# Patient Record
Sex: Male | Born: 1985 | Race: Black or African American | Hispanic: No | Marital: Single | Smoking: Never smoker
Health system: Southern US, Community
[De-identification: ages and names within clinical notes are randomized; demographics above are authoritative.]

---

## 2004-12-01 ENCOUNTER — Emergency Department: Payer: Self-pay | Admitting: Emergency Medicine

## 2006-09-27 ENCOUNTER — Emergency Department: Payer: Self-pay | Admitting: Unknown Physician Specialty

## 2011-05-02 ENCOUNTER — Emergency Department: Payer: Self-pay | Admitting: Unknown Physician Specialty

## 2013-10-27 ENCOUNTER — Emergency Department: Payer: Self-pay | Admitting: Emergency Medicine

## 2015-03-29 ENCOUNTER — Encounter: Payer: Self-pay | Admitting: Emergency Medicine

## 2015-03-29 ENCOUNTER — Emergency Department: Payer: Self-pay

## 2015-03-29 ENCOUNTER — Emergency Department
Admission: EM | Admit: 2015-03-29 | Discharge: 2015-03-29 | Disposition: A | Discharge status: Home / Self Care | Payer: Self-pay | Attending: Emergency Medicine | Admitting: Emergency Medicine

## 2015-03-29 DIAGNOSIS — Y998 Other external cause status: Secondary | ICD-10-CM | POA: Insufficient documentation

## 2015-03-29 DIAGNOSIS — Y9389 Activity, other specified: Secondary | ICD-10-CM | POA: Insufficient documentation

## 2015-03-29 DIAGNOSIS — S199XXA Unspecified injury of neck, initial encounter: Secondary | ICD-10-CM | POA: Insufficient documentation

## 2015-03-29 DIAGNOSIS — Y92410 Unspecified street and highway as the place of occurrence of the external cause: Secondary | ICD-10-CM | POA: Insufficient documentation

## 2015-03-29 MED ORDER — CYCLOBENZAPRINE HCL 10 MG PO TABS: 10.0000 mg | ORAL_TABLET | Freq: Three times a day (TID) | ORAL | Status: DC | PRN

## 2015-03-29 MED ORDER — IBUPROFEN 800 MG PO TABS: 800.0000 mg | ORAL_TABLET | Freq: Three times a day (TID) | ORAL | Status: DC | PRN
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
## 2015-08-17 ENCOUNTER — Encounter: Payer: Self-pay | Admitting: *Deleted

## 2015-08-17 ENCOUNTER — Emergency Department
Admission: EM | Admit: 2015-08-17 | Discharge: 2015-08-17 | Disposition: A | Discharge status: Home / Self Care | Payer: Self-pay | Attending: Emergency Medicine | Admitting: Emergency Medicine

## 2015-08-17 DIAGNOSIS — B349 Viral infection, unspecified: Secondary | ICD-10-CM | POA: Insufficient documentation

## 2015-08-17 LAB — POCT RAPID STREP A: Streptococcus, Group A Screen (Direct): NEGATIVE

## 2015-08-17 MED ORDER — PROMETHAZINE HCL 12.5 MG PO TABS: 12.5000 mg | ORAL_TABLET | Freq: Four times a day (QID) | ORAL | Status: DC | PRN

## 2015-08-17 MED ORDER — ONDANSETRON 4 MG PO TBDP
4.0000 mg | ORAL_TABLET | Freq: Once | ORAL | Status: AC
  Administered 2015-08-17: 4 mg via ORAL
  Filled 2015-08-17: qty 1

## 2015-08-17 MED ORDER — NAPROXEN 500 MG PO TABS: 500.0000 mg | ORAL_TABLET | Freq: Two times a day (BID) | ORAL | Status: AC

## 2015-08-17 NOTE — ED Provider Notes (Signed)
[facility] Emergency Department Provider Note  ____________________________________________  Time seen: Approximately 4:13 PM  I have reviewed the triage vital signs and the nursing notes.   HISTORY  Chief Complaint Sore Throat   HPI Marcus Dawson is a 29 y.o. male who presents to the emergency department for evaluation of nausea and sore throat. Nausea started on Sunday. Sore throat started on Monday. Nausea is intermittent. Pain in throat is 7/10. He has not taken anything for pain. He has had no known exposures to illness. He denies fever or cough.  History reviewed. No pertinent past medical history.  There are no active problems to display for this patient.   History reviewed. No pertinent past surgical history.  Current Outpatient Rx  Name  Route  Sig  Dispense  Refill  . cyclobenzaprine (FLEXERIL) 10 MG tablet   Oral   Take 1 tablet (10 mg total) by mouth every 8 (eight) hours as needed for muscle spasms.   30 tablet   1   . ibuprofen (ADVIL,MOTRIN) 800 MG tablet   Oral   Take 1 tablet (800 mg total) by mouth every 8 (eight) hours as needed.   30 tablet   0   . naproxen (NAPROSYN) 500 MG tablet   Oral   Take 1 tablet (500 mg total) by mouth 2 (two) times daily with a meal.   30 tablet   0   . promethazine (PHENERGAN) 12.5 MG tablet   Oral   Take 1 tablet (12.5 mg total) by mouth every 6 (six) hours as needed for nausea or vomiting.   30 tablet   0     Allergies Review of patient's allergies indicates no known allergies.  Family History  Problem Relation Age of Onset  . Diabetes Mother     Social History Social History  Substance Use Topics  . Smoking status: Never Smoker   . Smokeless tobacco: None  . Alcohol Use: No    Review of Systems Constitutional: Negative for fever. Eyes: No visual changes. ENT: Positive for sore throat; negative for difficulty swallowing. Respiratory: Denies shortness of  breath. Gastrointestinal: No abdominal pain.  Positive for nausea, no vomiting.  No diarrhea.  Genitourinary: Negative for dysuria. Musculoskeletal: Negative for generalized body aches. Skin: Negative for rash. Neurological: Negative for headaches, focal weakness or numbness.  10-point ROS otherwise negative.  ____________________________________________   PHYSICAL EXAM:  VITAL SIGNS: ED Triage Vitals  Enc Vitals Group     BP 08/17/15 1506 108/61 mmHg     Pulse Rate 08/17/15 1506 106     Resp 08/17/15 1506 20     Temp 08/17/15 1506 98.5 F (36.9 C)     Temp Source 08/17/15 1506 Oral     SpO2 08/17/15 1506 95 %     Weight 08/17/15 1506 170 lb (77.111 kg)     Height 08/17/15 1506 5\' 9"  (1.753 m)     Head Cir --      Peak Flow --      Pain Score 08/17/15 1505 7     Pain Loc --      Pain Edu? --      Excl. in GC? --     Constitutional: Alert and oriented. Well appearing and in no acute distress. Eyes: Conjunctivae are normal. PERRL. EOMI. Head: Atraumatic. Nose: No congestion/rhinnorhea. Mouth/Throat: Mucous membranes are moist.  Oropharynx erythematous, without exudate. Neck: No stridor.  Lymphatic: Lymphadenopathy: None Cardiovascular: Normal rate, regular rhythm. Good peripheral circulation.  Respiratory: Normal respiratory effort. Lungs CTAB. Gastrointestinal: Soft and nontender. Musculoskeletal: No lower extremity tenderness nor edema.   Neurologic:  Normal speech and language. No gross focal neurologic deficits are appreciated. Speech is normal. No gait instability. Skin:  Skin is warm, dry and intact. No rash noted Psychiatric: Mood and affect are normal. Speech and behavior are normal.  ____________________________________________   LABS (all labs ordered are listed, but only abnormal results are displayed)  Labs Reviewed  CULTURE, GROUP A STREP ([facility] ONLY)  POCT RAPID STREP A    ____________________________________________  EKG   ____________________________________________  RADIOLOGY   ____________________________________________   PROCEDURES  Procedure(s) performed: None  Critical Care performed: No  ____________________________________________   INITIAL IMPRESSION / ASSESSMENT AND PLAN / ED COURSE  Pertinent labs & imaging results that were available during my care of the patient were reviewed by me and considered in my medical decision making (see chart for details).  Patient was advised to follow-up with the primary care provider of his choice for symptoms are not improving over the next few days. He was advised to return to emergency department for symptoms that change or worsen if unable to schedule an appointment. ____________________________________________   FINAL CLINICAL IMPRESSION(S) / ED DIAGNOSES  Final diagnoses:  Acute viral syndrome      Chinita Pester, FNP 08/17/15 1637  Jennye Moccasin, MD 08/17/15 2006

## 2015-08-17 NOTE — ED Notes (Signed)
Pt reports sore throat along with intermittent nausea since Sunday.

## 2015-08-19 LAB — CULTURE, GROUP A STREP (THRC)

## 2015-08-22 ENCOUNTER — Emergency Department
Admission: EM | Admit: 2015-08-22 | Discharge: 2015-08-22 | Disposition: A | Discharge status: Home / Self Care | Payer: Self-pay | Attending: Emergency Medicine | Admitting: Emergency Medicine

## 2015-08-22 DIAGNOSIS — Z791 Long term (current) use of non-steroidal anti-inflammatories (NSAID): Secondary | ICD-10-CM | POA: Insufficient documentation

## 2015-08-22 DIAGNOSIS — J029 Acute pharyngitis, unspecified: Secondary | ICD-10-CM

## 2015-08-22 LAB — CBC WITH DIFFERENTIAL/PLATELET
Basophils Absolute: 0 10*3/uL (ref 0–0.1)
Basophils Relative: 1 %
EOS ABS: 0.3 10*3/uL (ref 0–0.7)
EOS PCT: 11 %
HCT: 40.8 % (ref 40.0–52.0)
Hemoglobin: 13.1 g/dL (ref 13.0–18.0)
LYMPHS ABS: 1.2 10*3/uL (ref 1.0–3.6)
Lymphocytes Relative: 38 %
MCH: 25.4 pg — AB (ref 26.0–34.0)
MCHC: 32.1 g/dL (ref 32.0–36.0)
MCV: 79.3 fL — ABNORMAL LOW (ref 80.0–100.0)
MONO ABS: 0.6 10*3/uL (ref 0.2–1.0)
MONOS PCT: 20 %
Neutro Abs: 1 10*3/uL — ABNORMAL LOW (ref 1.4–6.5)
Neutrophils Relative %: 30 %
PLATELETS: 221 10*3/uL (ref 150–440)
RBC: 5.14 MIL/uL (ref 4.40–5.90)
RDW: 13.5 % (ref 11.5–14.5)
WBC: 3.2 10*3/uL — ABNORMAL LOW (ref 3.8–10.6)

## 2015-08-22 LAB — BASIC METABOLIC PANEL
Anion gap: 6 (ref 5–15)
BUN: 13 mg/dL (ref 6–20)
CALCIUM: 9.1 mg/dL (ref 8.9–10.3)
CHLORIDE: 103 mmol/L (ref 101–111)
CO2: 28 mmol/L (ref 22–32)
CREATININE: 1.08 mg/dL (ref 0.61–1.24)
GFR calc Af Amer: >60 mL/min (ref >60)
GFR calc non Af Amer: >60 mL/min (ref >60)
GLUCOSE: 98 mg/dL (ref 65–99)
Potassium: 3.6 mmol/L (ref 3.5–5.1)
Sodium: 137 mmol/L (ref 135–145)

## 2015-08-22 LAB — MONONUCLEOSIS SCREEN: MONO SCREEN: NEGATIVE

## 2015-08-22 NOTE — Discharge Instructions (Signed)
Pharyngitis Pharyngitis is a sore throat (pharynx). There is redness, pain, and swelling of your throat. HOME CARE   Drink enough fluids to keep your pee (urine) clear or pale yellow.  Only take medicine as told by your doctor.  You may get sick again if you do not take medicine as told. Finish your medicines, even if you start to feel better.  Do not take aspirin.  Rest.  Rinse your mouth (gargle) with salt water ( tsp of salt per 1 qt of water) every 1-2 hours. This will help the pain.  If you are not at risk for choking, you can suck on hard candy or sore throat lozenges. GET HELP IF:  You have large, tender lumps on your neck.  You have a rash.  You cough up green, yellow-brown, or bloody spit. GET HELP RIGHT AWAY IF:   You have a stiff neck.  You drool or cannot swallow liquids.  You throw up (vomit) or are not able to keep medicine or liquids down.  You have very bad pain that does not go away with medicine.  You have problems breathing (not from a stuffy nose). MAKE SURE YOU:   Understand these instructions.  Will watch your condition.  Will get help right away if you are not doing well or get worse.   This information is not intended to replace advice given to you by your health care provider. Make sure you discuss any questions you have with your health care provider.   Document Released: 04/02/2008 Document Revised: 08/05/2013 Document Reviewed: 06/22/2013 Elsevier Interactive Patient Education 2016 [employer]   Increase fluids, Tylenol or Advil,  ibuprofen for throat pain. You may obtain Zyrtec-D or Claritin-D as needed for nasal congestion and posterior drainage. [facility].

## 2015-08-22 NOTE — ED Provider Notes (Signed)
[facility] Emergency Department Provider Note  ____________________________________________  Time seen: Approximately 1:26 PM  I have reviewed the triage vital signs and the nursing notes.   HISTORY  Chief Complaint Sore Throat   HPI Marcus Dawson is a 29 y.o. male is here with complaint of sore throat, runny nose and congestion. Patient states he began feeling bad yesterday but has been seen 2 weeks ago and states that he never got any better. He was seen in the emergency room at that time on 10/19 and given antibiotics which he states did not help his throat. He continues to feel like he is having chills but has not taken his temperature. He continues to eat and drink as normal. He states that he is sleeping more. He denies any vomiting or diarrhea.   History reviewed. No pertinent past medical history.  There are no active problems to display for this patient.   History reviewed. No pertinent past surgical history.  Current Outpatient Rx  Name  Route  Sig  Dispense  Refill  . cyclobenzaprine (FLEXERIL) 10 MG tablet   Oral   Take 1 tablet (10 mg total) by mouth every 8 (eight) hours as needed for muscle spasms.   30 tablet   1   . ibuprofen (ADVIL,MOTRIN) 800 MG tablet   Oral   Take 1 tablet (800 mg total) by mouth every 8 (eight) hours as needed.   30 tablet   0   . naproxen (NAPROSYN) 500 MG tablet   Oral   Take 1 tablet (500 mg total) by mouth 2 (two) times daily with a meal.   30 tablet   0   . promethazine (PHENERGAN) 12.5 MG tablet   Oral   Take 1 tablet (12.5 mg total) by mouth every 6 (six) hours as needed for nausea or vomiting.   30 tablet   0     Allergies Review of patient's allergies indicates no known allergies.  Family History  Problem Relation Age of Onset  . Diabetes Mother     Social History Social History  Substance Use Topics  . Smoking status: Never Smoker   . Smokeless tobacco: None  . Alcohol Use:  No    Review of Systems Constitutional: The patient will fever/positive chills Eyes: No visual changes. ENT: Positive sore throat. Cardiovascular: Denies chest pain. Respiratory: Denies shortness of breath. Gastrointestinal:   No nausea, no vomiting.  Genitourinary: Negative for dysuria. Musculoskeletal: Negative for back pain. Skin: Negative for rash. Neurological: Negative for headaches, focal weakness or numbness.  10-point ROS otherwise negative.  ____________________________________________   PHYSICAL EXAM:  VITAL SIGNS: ED Triage Vitals  Enc Vitals Group     BP 08/22/15 1300 121/70 mmHg     Pulse Rate 08/22/15 1300 88     Resp 08/22/15 1300 16     Temp 08/22/15 1300 98.1 F (36.7 C)     Temp Source 08/22/15 1300 Oral     SpO2 08/22/15 1300 100 %     Weight 08/22/15 1300 185 lb (83.915 kg)     Height 08/22/15 1300 5\' 9"  (1.753 m)     Head Cir --      Peak Flow --      Pain Score --      Pain Loc --      Pain Edu? --      Excl. in GC? --     Constitutional: Alert and oriented. Well appearing and in no acute distress.  Eyes: Conjunctivae are normal. PERRL. EOMI. Head: Atraumatic. Nose: No congestion/rhinnorhea. Mouth/Throat: Mucous membranes are moist.  Oropharynx non-erythematous. Positive posterior drainage Neck: No stridor.   Hematological/Lymphatic/Immunilogical: Minimal bilateral cervical lymphadenopathy. Cardiovascular: Normal rate, regular rhythm. Grossly normal heart sounds.  Good peripheral circulation. Respiratory: Normal respiratory effort.  No retractions. Lungs CTAB. Gastrointestinal: Soft and nontender. No distention.  Musculoskeletal: No lower extremity tenderness nor edema.  No joint effusions. Neurologic:  Normal speech and language. No gross focal neurologic deficits are appreciated. No gait instability. Skin:  Skin is warm, dry and intact. No rash noted. Psychiatric: Mood and affect are normal. Speech and behavior are  normal.  ____________________________________________   LABS (all labs ordered are listed, but only abnormal results are displayed)  Labs Reviewed  CBC WITH DIFFERENTIAL/PLATELET - Abnormal; Notable for the following:    WBC 3.2 (*)    MCV 79.3 (*)    MCH 25.4 (*)    Neutro Abs 1.0 (*)    All other components within normal limits  BASIC METABOLIC PANEL  MONONUCLEOSIS SCREEN    PROCEDURES  Procedure(s) performed: None  Critical Care performed: No  ____________________________________________   INITIAL IMPRESSION / ASSESSMENT AND PLAN / ED COURSE  Pertinent labs & imaging results that were available during my care of the patient were reviewed by me and considered in my medical decision making (see chart for details).  Patient was made aware that his mono test was negative. He is aware that this is viral and that the virus needs to run it's course. He is also instructed to obtain either Claritin-D or Zyrtec-D as needed for nasal congestion and drainage. He will follow-up with [facility] clinic acute-care if any continued problems. ____________________________________________   FINAL CLINICAL IMPRESSION(S) / ED DIAGNOSES  Final diagnoses:  Viral pharyngitis      Tommi Rumps, PA-C 08/22/15 1514  Emily Filbert, MD 08/22/15 [phone]

## 2015-08-22 NOTE — ED Notes (Signed)
Patient started feeling bad yesterday with sore throat, running nose, and congestion.

## 2015-08-22 NOTE — ED Notes (Signed)
Pt's acuity changed per [place]
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
## 2015-11-27 ENCOUNTER — Encounter: Payer: Self-pay | Admitting: Emergency Medicine

## 2015-11-27 ENCOUNTER — Emergency Department
Admission: EM | Admit: 2015-11-27 | Discharge: 2015-11-27 | Disposition: A | Discharge status: Home / Self Care | Payer: Self-pay | Attending: Emergency Medicine | Admitting: Emergency Medicine

## 2015-11-27 DIAGNOSIS — J029 Acute pharyngitis, unspecified: Secondary | ICD-10-CM | POA: Insufficient documentation

## 2015-11-27 DIAGNOSIS — R509 Fever, unspecified: Secondary | ICD-10-CM

## 2015-11-27 DIAGNOSIS — Z791 Long term (current) use of non-steroidal anti-inflammatories (NSAID): Secondary | ICD-10-CM | POA: Insufficient documentation

## 2015-11-27 LAB — POCT RAPID STREP A: Streptococcus, Group A Screen (Direct): NEGATIVE

## 2015-11-27 MED ORDER — IBUPROFEN 800 MG PO TABS
800.0000 mg | ORAL_TABLET | Freq: Once | ORAL | Status: AC
  Administered 2015-11-27: 800 mg via ORAL
  Filled 2015-11-27: qty 1

## 2015-11-27 MED ORDER — AMOXICILLIN 500 MG PO CAPS
500.0000 mg | ORAL_CAPSULE | Freq: Once | ORAL | Status: AC
  Administered 2015-11-27: 500 mg via ORAL
  Filled 2015-11-27: qty 1

## 2015-11-27 MED ORDER — AMOXICILLIN 500 MG PO CAPS: 500.0000 mg | ORAL_CAPSULE | Freq: Three times a day (TID) | ORAL | Status: DC

## 2015-11-27 MED ORDER — MAGIC MOUTHWASH
10.0000 mL | Freq: Once | ORAL | Status: AC
  Administered 2015-11-27: 10 mL via ORAL
  Filled 2015-11-27: qty 10

## 2015-11-27 MED ORDER — MAGIC MOUTHWASH: 5.0000 mL | Freq: Three times a day (TID) | ORAL | Status: DC | PRN

## 2015-11-27 NOTE — ED Notes (Signed)
Dr. Sung at bedside.  

## 2015-11-27 NOTE — ED Notes (Signed)
Patient with complaint of fever, sore throat and headache that started last night.

## 2015-11-27 NOTE — Discharge Instructions (Signed)
1. Alternate Tylenol Motrin every 4 hours as needed for fever greater than 100.37F. 2. Take antibiotic as prescribed (Amoxicillin  three times daily x 7 days). 3. Use Magic mouthwash as needed for throat discomfort.  4. Return to the ER for worsening symptoms, persistent vomiting, difficulty breathing or other concerns.  Fever, Adult A fever is an increase in the body's temperature. It is usually defined as a temperature of 100F (38C) or higher. Brief mild or moderate fevers generally have no long-term effects, and they often do not require treatment. Moderate or high fevers may make you feel uncomfortable and can sometimes be a sign of a serious illness or disease. The sweating that may occur with repeated or prolonged fever may also cause dehydration. Fever is confirmed by taking a temperature with a thermometer. A measured temperature can vary with:  Age.  Time of day.  Location of the thermometer:  Mouth (oral).  Rectum (rectal).  Ear (tympanic).  Underarm (axillary).  Forehead (temporal). HOME CARE INSTRUCTIONS Pay attention to any changes in your symptoms. Take these actions to help with your condition:  Take over-the counter and prescription medicines only as told by your health care provider. Follow the dosing instructions carefully.  If you were prescribed an antibiotic medicine, take it as told by your health care provider. Do not stop taking the antibiotic even if you start to feel better.  Rest as needed.  Drink enough fluid to keep your urine clear or pale yellow. This helps to prevent dehydration.  Sponge yourself or bathe with room-temperature water to help reduce your body temperature as needed. Do not use ice water.  Do not overbundle yourself in blankets or heavy clothes. SEEK MEDICAL CARE IF:  You vomit.  You cannot eat or drink without vomiting.  You have diarrhea.  You have pain when you urinate.  Your symptoms do not improve with  treatment.  You develop new symptoms.  You develop excessive weakness. SEEK IMMEDIATE MEDICAL CARE IF:  You have shortness of breath or have trouble breathing.  You are dizzy or you faint.  You are disoriented or confused.  You develop signs of dehydration, such as a dry mouth, decreased urination, or paleness.  You develop severe pain in your abdomen.  You have persistent vomiting or diarrhea.  You develop a skin rash.  Your symptoms suddenly get worse.   This information is not intended to replace advice given to you by your health care provider. Make sure you discuss any questions you have with your health care provider.   Document Released: 04/10/2001 Document Revised: 07/06/2015 Document Reviewed: 12/09/2014 Elsevier Interactive Patient Education 2016 [employer]  Pharyngitis Pharyngitis is redness, pain, and swelling (inflammation) of your pharynx.  CAUSES  Pharyngitis is usually caused by infection. Most of the time, these infections are from viruses (viral) and are part of a cold. However, sometimes pharyngitis is caused by bacteria (bacterial). Pharyngitis can also be caused by allergies. Viral pharyngitis may be spread from person to person by coughing, sneezing, and personal items or utensils (cups, forks, spoons, toothbrushes). Bacterial pharyngitis may be spread from person to person by more intimate contact, such as kissing.  SIGNS AND SYMPTOMS  Symptoms of pharyngitis include:   Sore throat.   Tiredness (fatigue).   Low-grade fever.   Headache.  Joint pain and muscle aches.  Skin rashes.  Swollen lymph nodes.  Plaque-like film on throat or tonsils (often seen with bacterial pharyngitis). DIAGNOSIS  Your health care provider will  ask you questions about your illness and your symptoms. Your medical history, along with a physical exam, is often all that is needed to diagnose pharyngitis. Sometimes, a rapid strep test is done. Other lab tests may  also be done, depending on the suspected cause.  TREATMENT  Viral pharyngitis will usually get better in 3-4 days without the use of medicine. Bacterial pharyngitis is treated with medicines that kill germs (antibiotics).  HOME CARE INSTRUCTIONS   Drink enough water and fluids to keep your urine clear or pale yellow.   Only take over-the-counter or prescription medicines as directed by your health care provider:   If you are prescribed antibiotics, make sure you finish them even if you start to feel better.   Do not take aspirin.   Get lots of rest.   Gargle with 8 oz of salt water ( tsp of salt per 1 qt of water) as often as every 1-2 hours to soothe your throat.   Throat lozenges (if you are not at risk for choking) or sprays may be used to soothe your throat. SEEK MEDICAL CARE IF:   You have large, tender lumps in your neck.  You have a rash.  You cough up green, yellow-brown, or bloody spit. SEEK IMMEDIATE MEDICAL CARE IF:   Your neck becomes stiff.  You drool or are unable to swallow liquids.  You vomit or are unable to keep medicines or liquids down.  You have severe pain that does not go away with the use of recommended medicines.  You have trouble breathing (not caused by a stuffy nose). MAKE SURE YOU:   Understand these instructions.  Will watch your condition.  Will get help right away if you are not doing well or get worse.   This information is not intended to replace advice given to you by your health care provider. Make sure you discuss any questions you have with your health care provider.   Document Released: 10/15/2005 Document Revised: 08/05/2013 Document Reviewed: 06/22/2013 Elsevier Interactive Patient Education [employer]

## 2015-11-27 NOTE — ED Notes (Signed)
Pt reports sore throat, fever, and headache x 1 day. Pt reports some sinus congestion x 1 week.  Pt reports child has similar illness.  Pt localizes HA to front of head, described as pressure and throbbing.  Pt NAD at this time, respirations equal and unlabored, skin warm and dry.

## 2015-11-27 NOTE — ED Provider Notes (Signed)
[facility] Emergency Department Provider Note  ____________________________________________  Time seen: Approximately 5:28 AM  I have reviewed the triage vital signs and the nursing notes.   HISTORY  Chief Complaint Fever; Sore Throat; and Headache    HPI ARGEL PABLO is a 30 y.o. male who presents to the ED from home with a chief complaint of fever, sore throat, headache and sinus congestion. Patient reports sinus congestion 1 week; + sick contacts. Developed low-grade fever, sore throat and headache 1 day. Denies cough, chest pain, shortness of breath, abdominal pain, nausea, vomiting, diarrhea. Denies recent travel trauma. Nothing makes his symptoms better or worse.   Past medical history None  There are no active problems to display for this patient.   History reviewed. No pertinent past surgical history.  Current Outpatient Rx  Name  Route  Sig  Dispense  Refill  . cyclobenzaprine (FLEXERIL) 10 MG tablet   Oral   Take 1 tablet (10 mg total) by mouth every 8 (eight) hours as needed for muscle spasms.   30 tablet   1   . ibuprofen (ADVIL,MOTRIN) 800 MG tablet   Oral   Take 1 tablet (800 mg total) by mouth every 8 (eight) hours as needed.   30 tablet   0   . naproxen (NAPROSYN) 500 MG tablet   Oral   Take 1 tablet (500 mg total) by mouth 2 (two) times daily with a meal.   30 tablet   0   . promethazine (PHENERGAN) 12.5 MG tablet   Oral   Take 1 tablet (12.5 mg total) by mouth every 6 (six) hours as needed for nausea or vomiting.   30 tablet   0     Allergies Review of patient's allergies indicates no known allergies.  Family History  Problem Relation Age of Onset  . Diabetes Mother     Social History Social History  Substance Use Topics  . Smoking status: Never Smoker   . Smokeless tobacco: None  . Alcohol Use: No    Review of Systems Constitutional: Positive for fever/chills Eyes: No visual changes. ENT:  Positive for sore throat. Cardiovascular: Denies chest pain. Respiratory: Denies shortness of breath. Gastrointestinal: No abdominal pain.  No nausea, no vomiting.  No diarrhea.  No constipation. Genitourinary: Negative for dysuria. Musculoskeletal: Negative for back pain. Skin: Negative for rash. Neurological: Positive for headache. Negative for focal weakness or numbness.  10-point ROS otherwise negative.  ____________________________________________   PHYSICAL EXAM:  VITAL SIGNS: ED Triage Vitals  Enc Vitals Group     BP 11/27/15 0510 126/79 mmHg     Pulse Rate 11/27/15 0510 118     Resp 11/27/15 0510 18     Temp 11/27/15 0510 100.2 F (37.9 C)     Temp Source 11/27/15 0510 Oral     SpO2 11/27/15 0510 98 %     Weight 11/27/15 0510 185 lb (83.915 kg)     Height 11/27/15 0510  (1.753 m)     Head Cir --      Peak Flow --      Pain Score 11/27/15 0511 6     Pain Loc --      Pain Edu? --      Excl. in GC? --     Constitutional: Alert and oriented. Well appearing and in no acute distress. Eyes: Conjunctivae are normal. PERRL. EOMI. Head: Atraumatic. Nose: No congestion/rhinnorhea. Mouth/Throat: Mucous membranes are moist.  Oropharynx moderately erythematous.  There  are no tonsillar exudates, swelling or peritonsillar abscess. There is no hoarse voice. There is no muffled voice or drooling. Neck: No stridor.  Supple neck without evidence for meningismus. Hematological/Lymphatic/Immunilogical: No cervical lymphadenopathy. Cardiovascular: Normal rate, regular rhythm. Grossly normal heart sounds.  Good peripheral circulation. Respiratory: Normal respiratory effort.  No retractions. Lungs CTAB. Gastrointestinal: Soft and nontender. No distention. No abdominal bruits. No CVA tenderness. Musculoskeletal: No lower extremity tenderness nor edema.  No joint effusions. Neurologic:  Normal speech and language. No gross focal neurologic deficits are appreciated. No gait  instability. Skin:  Skin is warm, dry and intact. No rash noted. Specifically, no petechiae. Psychiatric: Mood and affect are normal. Speech and behavior are normal.  ____________________________________________   LABS (all labs ordered are listed, but only abnormal results are displayed)  Labs Reviewed  CULTURE, GROUP A STREP [facility]  POCT RAPID STREP A   ____________________________________________  EKG  None ____________________________________________  RADIOLOGY  None ____________________________________________   PROCEDURES  Procedure(s) performed: None  Critical Care performed: No  ____________________________________________   INITIAL IMPRESSION / ASSESSMENT AND PLAN / ED COURSE  Pertinent labs & imaging results that were available during my care of the patient were reviewed by me and considered in my medical decision making (see chart for details).  30 year old male who presents with fever, sore throat, headache; rapid strep test negative. Will treat with amoxicillin, Magic mouthwash and follow-up with his PCP. Strict return precautions given. Patient verbalizes understanding and agrees with plan of care. ____________________________________________   FINAL CLINICAL IMPRESSION(S) / ED DIAGNOSES  Final diagnoses:  Fever, unspecified fever cause  Pharyngitis      Irean Hong, MD 11/27/15 [phone]

## 2015-11-29 LAB — CULTURE, GROUP A STREP (THRC)

## 2023-04-14 ENCOUNTER — Emergency Department
Admission: EM | Admit: 2023-04-14 | Discharge: 2023-04-14 | Disposition: A | Discharge status: Home / Self Care | Payer: Self-pay | Attending: Emergency Medicine | Admitting: Emergency Medicine

## 2023-04-14 ENCOUNTER — Encounter: Payer: Self-pay | Admitting: Emergency Medicine

## 2023-04-14 ENCOUNTER — Other Ambulatory Visit: Payer: Self-pay

## 2023-04-14 DIAGNOSIS — K047 Periapical abscess without sinus: Secondary | ICD-10-CM | POA: Insufficient documentation

## 2023-04-14 MED ORDER — IBUPROFEN 400 MG PO TABS
400.0000 mg | ORAL_TABLET | Freq: Once | ORAL | Status: AC
  Administered 2023-04-14: 400 mg via ORAL
  Filled 2023-04-14: qty 1

## 2023-04-14 MED ORDER — AMOXICILLIN-POT CLAVULANATE 875-125 MG PO TABS: 1.0000 | ORAL_TABLET | Freq: Two times a day (BID) | ORAL | 0 refills | Status: AC

## 2023-04-14 MED ORDER — AMOXICILLIN-POT CLAVULANATE 875-125 MG PO TABS
1.0000 | ORAL_TABLET | Freq: Once | ORAL | Status: AC
  Administered 2023-04-14: 1 via ORAL
  Filled 2023-04-14: qty 1

## 2023-04-14 NOTE — Discharge Instructions (Signed)
Please take the antibiotic twice a day for the next 7 days.  You can take ibuprofen or Motrin for pain and swelling.

## 2023-04-14 NOTE — ED Notes (Signed)
See triage note  Presents with facial  pain and swelling   States he thinks this may be his tooth  welling noted to right side of face  Afebrile on arrival

## 2023-04-14 NOTE — ED Triage Notes (Signed)
Pt states coming in with a chipped tooth for 1 week and now has some right upper facial swelling in the area of the chipped tooth. Pt states swelling for 2 days

## 2023-04-14 NOTE — ED Provider Notes (Signed)
[facility] Provider Note    Event Date/Time   First MD Initiated Contact with Patient 04/14/23 1344     (approximate)   History   Facial Swelling   HPI  Marcus Dawson is a 37 y.o. male who presents because of tooth pain.  Patient tells me that about several weeks ago he thinks he chipped one of his right upper teeth.  Then several days ago he was biting into chicken and it seemed to get more inflamed.  He has had some swelling of the right side of his face since Friday worsening this morning.  Still eating and drinking denies fevers or chills.  Does not have a dentist currently.  It is mildly painful.     History reviewed. No pertinent past medical history.  There are no problems to display for this patient.    Physical Exam  Triage Vital Signs: ED Triage Vitals  Enc Vitals Group     BP 04/14/23 1253 (!) 138/94     Pulse Rate 04/14/23 1253 (!) 105     Resp 04/14/23 1253 17     Temp 04/14/23 1253 98.7 F (37.1 C)     Temp src --      SpO2 04/14/23 1253 98 %     Weight 04/14/23 1251 178 lb (80.7 kg)     Height 04/14/23 1251 5\' 9"  (1.753 m)     Head Circumference --      Peak Flow --      Pain Score 04/14/23 1251 2     Pain Loc --      Pain Edu? --      Excl. in GC? --     Most recent vital signs: Vitals:   04/14/23 1253  BP: (!) 138/94  Pulse: (!) 105  Resp: 17  Temp: 98.7 F (37.1 C)  SpO2: 98%     General: Awake, no distress.  CV:  Good peripheral perfusion.  Resp:  Normal effort.  Abd:  No distention.  Neuro:             Awake, Alert, Oriented x 3  Other:  Patient has swelling over the right maxilla and with some tenderness to palpation just lateral to the right nare There is tenderness to percussion of the right lateral incisor with some tenderness to palpation in the buccal fold but no focal area of fluctuance No trismus   ED Results / Procedures / Treatments  Labs (all labs ordered are listed, but only abnormal  results are displayed) Labs Reviewed - No data to display   EKG     RADIOLOGY   PROCEDURES:  Critical Care performed: No  Procedures  The patient is on the cardiac monitor to evaluate for evidence of arrhythmia and/or significant heart rate changes.   MEDICATIONS ORDERED IN ED: Medications  ibuprofen (ADVIL) tablet 400 mg (has no administration in time range)  amoxicillin-clavulanate (AUGMENTIN) 875-125 MG per tablet 1 tablet (has no administration in time range)     IMPRESSION / MDM / ASSESSMENT AND PLAN / ED COURSE  I reviewed the triage vital signs and the nursing notes.                              Patient's presentation is most consistent with acute, uncomplicated illness.  Differential diagnosis includes, but is not limited to, pulpitis, periapical abscess, deep space infection  Patient is a 37 year old male is  otherwise healthy presents with facial swelling and tooth pain.  He thinks he chipped one of the right upper teeth several weeks ago and the seem to get more aggravated after he bit into chicken the other day and has now had some swelling of the right side of his face.  Has not had fevers or chills and is eating and drinking without difficulty swallowing.  Patient mildly tachycardic on arrival.  On exam he does have some swelling of the maxilla and cheek and area lateral to the right nares.  He has no trismus.  On exam there is tenderness to percussion of the right lateral incisor which seems to be the culprit tooth.  He has some tenderness in the buccal fold and I do suspect he has a periapical abscess but there is really no area of fluctuance that I think is amenable to drainage in the ED.  Will start on Augmentin for presumed periapical abscess.  Recommended NSAIDs for pain and swelling.  Strongly encouraged dental follow-up.       FINAL CLINICAL IMPRESSION(S) / ED DIAGNOSES   Final diagnoses:  Dental abscess     Rx / DC Orders   ED Discharge  Orders     None        Note:  This document was prepared using Dragon voice recognition software and may include unintentional dictation errors.   Georga Hacking, MD 04/14/23 [phone]

## 2023-04-18 ENCOUNTER — Emergency Department
Admission: EM | Admit: 2023-04-18 | Discharge: 2023-04-18 | Disposition: A | Discharge status: Home / Self Care | Payer: Self-pay | Attending: Emergency Medicine | Admitting: Emergency Medicine

## 2023-04-18 ENCOUNTER — Encounter: Payer: Self-pay | Admitting: Emergency Medicine

## 2023-04-18 ENCOUNTER — Other Ambulatory Visit: Payer: Self-pay

## 2023-04-18 DIAGNOSIS — K0501 Acute gingivitis, non-plaque induced: Secondary | ICD-10-CM | POA: Insufficient documentation

## 2023-04-18 DIAGNOSIS — K1370 Unspecified lesions of oral mucosa: Secondary | ICD-10-CM

## 2023-04-18 DIAGNOSIS — K0510 Chronic gingivitis, plaque induced: Secondary | ICD-10-CM

## 2023-04-18 NOTE — ED Notes (Signed)
See triage note   Presents with pain and some swelling noted to gum line  Provider in with pt on arrival

## 2023-04-18 NOTE — ED Triage Notes (Signed)
Pt here with an abscess on his gums that he would like removed. Pt states abscess is on the top right and he is having mild pain. Pt has been taking ibuprofen for relief.

## 2023-04-20 NOTE — ED Provider Notes (Signed)
   [facility] Provider Note    Event Date/Time   First MD Initiated Contact with Patient 04/18/23 1151     (approximate)   History   Abscess   HPI  Marcus Dawson is a 37 y.o. male with no significant past medical history and as listed in EMR presents to the emergency department for treatment and evaluation of an area on his upper gum that he believes to be an abscess.  He states that the area is tender.  No relief with ibuprofen.  Pain has been present for the past couple days..      Physical Exam   Triage Vital Signs: ED Triage Vitals [04/18/23 1133]  Enc Vitals Group     BP 127/78     Pulse Rate 82     Resp 16     Temp 98.6 F (37 C)     Temp Source Oral     SpO2 100 %     Weight 177 lb 14.6 oz (80.7 kg)     Height 5\' 9"  (1.753 m)     Head Circumference      Peak Flow      Pain Score 1     Pain Loc      Pain Edu?      Excl. in GC?     Most recent vital signs: Vitals:   04/18/23 1133  BP: 127/78  Pulse: 82  Resp: 16  Temp: 98.6 F (37 C)  SpO2: 100%    General: Awake, no distress.  CV:  Good peripheral perfusion.  Resp:  Normal effort.  Abd:  No distention.  Other:  Inflammation of upper gumline tender with no fluctuant area    ED Results / Procedures / Treatments   Labs (all labs ordered are listed, but only abnormal results are displayed) Labs Reviewed - No data to display   EKG     RADIOLOGY  Image and radiology report reviewed and interpreted by me. Radiology report consistent with the same.    PROCEDURES:  Critical Care performed: No  Procedures   MEDICATIONS ORDERED IN ED:  Medications - No data to display   IMPRESSION / MDM / ASSESSMENT AND PLAN / ED COURSE   I have reviewed the triage note.  Differential diagnosis includes, but is not limited to, abscess, gingivitis  Patient's presentation is most consistent with acute, uncomplicated illness.  37 year old male presenting to the  emergency department for treatment and evaluation of tender area to the upper gumline that he thought was an abscess.  There is no area of fluctuance.  This area appears to be gingivitis.  Proper oral hygiene was discussed. He was also advised to follow up with the dentist when possible.      FINAL CLINICAL IMPRESSION(S) / ED DIAGNOSES   Final diagnoses:  Gingivitis     Rx / DC Orders   ED Discharge Orders     None        Note:  This document was prepared using Dragon voice recognition software and may include unintentional dictation errors.   Chinita Pester, FNP 04/20/23 [id number]    Sharman Cheek, MD 04/21/23 [phone]
